# Patient Record
Sex: Male | Born: 1990 | Race: White | Hispanic: No | Marital: Single | Smoking: Current every day smoker
Health system: Southern US, Community
[De-identification: ages and names within clinical notes are randomized; demographics above are authoritative.]

## PROBLEM LIST (undated history)

## (undated) HISTORY — PX: WISDOM TOOTH EXTRACTION: SHX21

---

## 2012-03-22 ENCOUNTER — Emergency Department (HOSPITAL_BASED_OUTPATIENT_CLINIC_OR_DEPARTMENT_OTHER)
Admission: EM | Admit: 2012-03-22 | Discharge: 2012-03-22 | Disposition: A | Discharge status: Home / Self Care | Payer: 59 | Attending: Emergency Medicine | Admitting: Emergency Medicine

## 2012-03-22 ENCOUNTER — Encounter (HOSPITAL_BASED_OUTPATIENT_CLINIC_OR_DEPARTMENT_OTHER): Payer: Self-pay | Admitting: *Deleted

## 2012-03-22 DIAGNOSIS — F17200 Nicotine dependence, unspecified, uncomplicated: Secondary | ICD-10-CM | POA: Insufficient documentation

## 2012-03-22 DIAGNOSIS — Y9389 Activity, other specified: Secondary | ICD-10-CM | POA: Insufficient documentation

## 2012-03-22 DIAGNOSIS — W2209XA Striking against other stationary object, initial encounter: Secondary | ICD-10-CM | POA: Insufficient documentation

## 2012-03-22 DIAGNOSIS — Y929 Unspecified place or not applicable: Secondary | ICD-10-CM | POA: Insufficient documentation

## 2012-03-22 DIAGNOSIS — S0100XA Unspecified open wound of scalp, initial encounter: Secondary | ICD-10-CM | POA: Insufficient documentation

## 2012-03-22 DIAGNOSIS — S0101XA Laceration without foreign body of scalp, initial encounter: Secondary | ICD-10-CM

## 2012-03-22 MED ORDER — IBUPROFEN 400 MG PO TABS
600.0000 mg | ORAL_TABLET | Freq: Once | ORAL | Status: AC
  Administered 2012-03-22: 600 mg via ORAL
  Filled 2012-03-22: qty 1

## 2012-03-22 NOTE — ED Provider Notes (Signed)
History  This chart was scribed for Dale Racer, MD by Dale Morales, ED Scribe. This patient was seen in room MH03/[facility]03 and the patient's care was started at 3:25 PM.  CSN: [id number]  Arrival date & time 03/22/12  1546   First MD Initiated Contact with Patient 03/22/12 1725      Chief Complaint  Patient presents with  . Head Laceration     Patient is a 21 y.o. male presenting with scalp laceration. The history is provided by the patient. No language interpreter was used.  Head Laceration This is a new problem. The current episode started 3 to 5 hours ago. The problem occurs constantly. The problem has not changed since onset.Pertinent negatives include no chest pain, no abdominal pain, no headaches and no shortness of breath. Nothing aggravates the symptoms. Nothing relieves the symptoms. He has tried nothing for the symptoms.   Dale Morales is a 21 y.o. male who presents to the Emergency Department complaining of a laceration to the crown of the head after hitting his head on a light fixture while hanging Christmas decorations 3.5 hours ago. The bleeding is controlled currently. He reports pain behind right eye after hitting his head but denies taking OTC medications at home to improve symptoms. He denies LOC, nausea or blurred vision as associated symptoms. He does not have a h/o chronic medical conditions. He is a current everyday smoker and occasional alcohol user. TD vaccination is UTD, within the past 6 months.   History reviewed. No pertinent past medical history.  Past Surgical History  Procedure Date  . Wisdom tooth extraction     No family history on file.  History  Substance Use Topics  . Smoking status: Current Every Day Smoker  . Smokeless tobacco: Not on file  . Alcohol Use: 1.2 oz/week    2 Cans of beer per week      Review of Systems  Constitutional: Negative for fever and chills.  Respiratory: Negative for shortness of breath.   Cardiovascular:  Negative for chest pain.  Gastrointestinal: Negative for nausea, vomiting and abdominal pain.  Skin: Positive for wound (laceration to the crown of the head).  Neurological: Negative for headaches.  All other systems reviewed and are negative.    Allergies  Strattera and Sulfa antibiotics  Home Medications  No current outpatient prescriptions on file.  Triage Vitals: BP 110/69  Pulse 78  Temp 98.8 F (37.1 C) (Oral)  Resp 18  SpO2 100%  Physical Exam  Nursing note and vitals reviewed. Constitutional: He is oriented to person, place, and time. He appears well-developed and well-nourished. No distress.  HENT:  Head: Normocephalic and atraumatic.  Mouth/Throat: Oropharynx is clear and moist.       Linea 2 cm laceration to the crown of the head in the midline parietal area, no active bleeding  Eyes: Conjunctivae normal and EOM are normal. Pupils are equal, round, and reactive to light.  Neck: Normal range of motion. Neck supple. No tracheal deviation present.  Cardiovascular: Normal rate.   Pulmonary/Chest: Effort normal. No respiratory distress.  Musculoskeletal: Normal range of motion.  Neurological: He is alert and oriented to person, place, and time.  Skin: Skin is warm and dry.  Psychiatric: He has a normal mood and affect. His behavior is normal.    ED Course  Procedures (including critical care time)  DIAGNOSTIC STUDIES: Oxygen Saturation is 100% on room air, normal by my interpretation.    COORDINATION OF CARE: 5:58 PM- Applied two  staples to the midline parietal area, wound was well-approximated, pt tolerated well, no contamination    6:01 PM- Discussed discharge plan which includes motrin or tylenol for pain with pt and pt agreed to plan. Also advised pt to return to have staples removed in 7 days or if he develops changes in personality, nausea, emesis or severe HAs.   Labs Reviewed - No data to display No results found.   1. Scalp laceration      LACERATION REPAIR Performed by: Dale Morales Authorized by: Dale Morales, Dale Morales Consent: Verbal consent obtained. Risks and benefits: risks, benefits and alternatives were discussed Consent given by: patient Patient identity confirmed: provided demographic data Prepped and Draped in normal sterile fashion Wound explored  Laceration Location: scalp  Laceration Length: 2 cm  No Foreign Bodies seen or palpated  Anesthesia: local infiltration  Local anesthetic:none    Amount of cleaning: standard  Skin closure: staple  Number of staples: 2    Patient tolerance: Patient tolerated the procedure well with no immediate complications.   MDM  I personally performed the services described in this documentation, which was scribed in my presence. The recorded information has been reviewed and is accurate.    Dale Racer, MD 03/22/12 [phone]

## 2012-03-22 NOTE — ED Notes (Signed)
pts family member to the desk states " we are thinking about just leaving " told family  Member the physician is in with another patient and has signed up for him therefore will be in as soon as he can"

## 2012-03-22 NOTE — ED Notes (Signed)
Laceration cleansed bleeding was already controlled family member at the bedside states "We have been waiting so long it probably  already closed up" Explained to patient and family member that physician has signed up for them and will be in as soon as possible.

## 2012-03-22 NOTE — ED Notes (Signed)
Head lac in scalp from hitting head on light fixture- bleeding controlled- denies LOC

## 2012-03-29 ENCOUNTER — Emergency Department (HOSPITAL_BASED_OUTPATIENT_CLINIC_OR_DEPARTMENT_OTHER)
Admission: EM | Admit: 2012-03-29 | Discharge: 2012-03-29 | Disposition: A | Discharge status: Home / Self Care | Payer: 59 | Attending: Emergency Medicine | Admitting: Emergency Medicine

## 2012-03-29 ENCOUNTER — Encounter (HOSPITAL_BASED_OUTPATIENT_CLINIC_OR_DEPARTMENT_OTHER): Payer: Self-pay | Admitting: Emergency Medicine

## 2012-03-29 DIAGNOSIS — F17200 Nicotine dependence, unspecified, uncomplicated: Secondary | ICD-10-CM | POA: Insufficient documentation

## 2012-03-29 DIAGNOSIS — Z4802 Encounter for removal of sutures: Secondary | ICD-10-CM

## 2012-03-29 NOTE — ED Provider Notes (Signed)
History     CSN: [id number]  Arrival date & time 03/29/12  1138   First MD Initiated Contact with Patient 03/29/12 1154      Chief Complaint  Patient presents with  . Suture / Staple Removal    (Consider location/radiation/quality/duration/timing/severity/associated sxs/prior treatment) HPI Comments: Presents for staple removal from scalp. Denies pain, bleeding, drainage. No complaints. Place one week ago.  Patient is a 21 y.o. male presenting with suture removal. The history is provided by the patient.  Suture / Staple Removal  The sutures were placed 7 to 10 days ago. There has been no treatment since the wound repair.    No past medical history on file.  Past Surgical History  Procedure Date  . Wisdom tooth extraction     No family history on file.  History  Substance Use Topics  . Smoking status: Current Every Day Smoker  . Smokeless tobacco: Not on file  . Alcohol Use: 1.2 oz/week    2 Cans of beer per week      Review of Systems  Skin: Positive for wound.  Neurological: Negative for headaches.    Allergies  Strattera and Sulfa antibiotics  Home Medications  No current outpatient prescriptions on file.  BP 102/64  Pulse 72  Temp 98.7 F (37.1 C) (Oral)  Resp 18  Ht 5\' 10"  (1.778 m)  Wt 140 lb (63.504 kg)  BMI 20.09 kg/m2  SpO2 100%  Physical Exam  Constitutional: He is oriented to person, place, and time. He appears well-developed and well-nourished. No distress.  HENT:  Head: Normocephalic and atraumatic.  Mouth/Throat: Oropharynx is clear and moist. No oropharyngeal exudate.       2 staples intact vertex of scalp, well healed wound, no bleeding or drainage.  Eyes: Conjunctivae normal and EOM are normal. Pupils are equal, round, and reactive to light.  Neck: Normal range of motion. Neck supple.  Cardiovascular: Normal rate, regular rhythm and normal heart sounds.   No murmur heard. Pulmonary/Chest: Effort normal and breath sounds normal.  No respiratory distress.  Abdominal: Soft. There is no tenderness. There is no rebound and no guarding.  Musculoskeletal: Normal range of motion. He exhibits no edema and no tenderness.  Neurological: He is alert and oriented to person, place, and time. No cranial nerve deficit. He exhibits normal muscle tone.  Skin: Skin is warm.    ED Course  SUTURE REMOVAL Date/Time: 03/29/2012 12:09 PM Performed by: Glynn Octave Authorized by: Glynn Octave Consent: Verbal consent obtained. Risks and benefits: risks, benefits and alternatives were discussed Consent given by: patient Patient understanding: patient states understanding of the procedure being performed Patient identity confirmed: verbally with patient Body area: head/neck Location details: scalp Wound Appearance: clean Staples Removed: 2 Facility: sutures placed in this facility Patient tolerance: Patient tolerated the procedure well with no immediate complications.   (including critical care time)  Labs Reviewed - No data to display No results found.   1. Encounter for staple removal       MDM  Stable removal of the scalp. Wound healed well.        Glynn Octave, MD 03/29/12 1210

## 2012-03-29 NOTE — ED Notes (Signed)
Staple removal.  No problems or signs of infection.

## 2012-05-30 ENCOUNTER — Encounter (HOSPITAL_BASED_OUTPATIENT_CLINIC_OR_DEPARTMENT_OTHER): Payer: Self-pay | Admitting: *Deleted

## 2012-05-30 ENCOUNTER — Emergency Department (HOSPITAL_BASED_OUTPATIENT_CLINIC_OR_DEPARTMENT_OTHER): Payer: 59

## 2012-05-30 ENCOUNTER — Emergency Department (HOSPITAL_BASED_OUTPATIENT_CLINIC_OR_DEPARTMENT_OTHER)
Admission: EM | Admit: 2012-05-30 | Discharge: 2012-05-30 | Disposition: A | Discharge status: Home / Self Care | Payer: 59 | Attending: Emergency Medicine | Admitting: Emergency Medicine

## 2012-05-30 DIAGNOSIS — R5383 Other fatigue: Secondary | ICD-10-CM | POA: Insufficient documentation

## 2012-05-30 DIAGNOSIS — R42 Dizziness and giddiness: Secondary | ICD-10-CM | POA: Insufficient documentation

## 2012-05-30 DIAGNOSIS — F17200 Nicotine dependence, unspecified, uncomplicated: Secondary | ICD-10-CM | POA: Insufficient documentation

## 2012-05-30 DIAGNOSIS — R5381 Other malaise: Secondary | ICD-10-CM | POA: Insufficient documentation

## 2012-05-30 DIAGNOSIS — K5289 Other specified noninfective gastroenteritis and colitis: Secondary | ICD-10-CM | POA: Insufficient documentation

## 2012-05-30 DIAGNOSIS — R509 Fever, unspecified: Secondary | ICD-10-CM | POA: Insufficient documentation

## 2012-05-30 DIAGNOSIS — R197 Diarrhea, unspecified: Secondary | ICD-10-CM | POA: Insufficient documentation

## 2012-05-30 DIAGNOSIS — K529 Noninfective gastroenteritis and colitis, unspecified: Secondary | ICD-10-CM

## 2012-05-30 DIAGNOSIS — R109 Unspecified abdominal pain: Secondary | ICD-10-CM | POA: Insufficient documentation

## 2012-05-30 DIAGNOSIS — R51 Headache: Secondary | ICD-10-CM | POA: Insufficient documentation

## 2012-05-30 LAB — COMPREHENSIVE METABOLIC PANEL
ALT: 13 U/L (ref 0–53)
Alkaline Phosphatase: 87 U/L (ref 39–117)
BUN: 19 mg/dL (ref 6–23)
CO2: 22 mEq/L (ref 19–32)
Chloride: 103 mEq/L (ref 96–112)
GFR calc Af Amer: >90 mL/min (ref >90)
Glucose, Bld: 110 mg/dL — ABNORMAL HIGH (ref 70–99)
Potassium: 4.3 mEq/L (ref 3.5–5.1)
Sodium: 139 mEq/L (ref 135–145)
Total Bilirubin: 0.7 mg/dL (ref 0.3–1.2)
Total Protein: 7.9 g/dL (ref 6.0–8.3)

## 2012-05-30 LAB — CBC WITH DIFFERENTIAL/PLATELET
Hemoglobin: 17.3 g/dL — ABNORMAL HIGH (ref 13.0–17.0)
Lymphocytes Relative: 4 % — ABNORMAL LOW (ref 12–46)
Lymphs Abs: 0.5 10*3/uL — ABNORMAL LOW (ref 0.7–4.0)
MCH: 32.3 pg (ref 26.0–34.0)
Monocytes Relative: 5 % (ref 3–12)
Neutro Abs: 13.6 10*3/uL — ABNORMAL HIGH (ref 1.7–7.7)
Neutrophils Relative %: 91 % — ABNORMAL HIGH (ref 43–77)
Platelets: 189 10*3/uL (ref 150–400)
RBC: 5.35 MIL/uL (ref 4.22–5.81)
WBC: 15 10*3/uL — ABNORMAL HIGH (ref 4.0–10.5)

## 2012-05-30 LAB — URINALYSIS, ROUTINE W REFLEX MICROSCOPIC
Bilirubin Urine: NEGATIVE
Ketones, ur: NEGATIVE mg/dL
Nitrite: NEGATIVE
Protein, ur: NEGATIVE mg/dL
pH: 5 (ref 5.0–8.0)

## 2012-05-30 MED ORDER — SODIUM CHLORIDE 0.9 % IV SOLN
Freq: Once | INTRAVENOUS | Status: AC
  Administered 2012-05-30: 19:00:00 via INTRAVENOUS

## 2012-05-30 MED ORDER — ONDANSETRON HCL 4 MG PO TABS: 4.0000 mg | ORAL_TABLET | Freq: Four times a day (QID) | ORAL | Status: AC

## 2012-05-30 MED ORDER — SODIUM CHLORIDE 0.9 % IV SOLN
Freq: Once | INTRAVENOUS | Status: AC
  Administered 2012-05-30: 15:00:00 via INTRAVENOUS

## 2012-05-30 MED ORDER — HYDROMORPHONE HCL PF 1 MG/ML IJ SOLN
1.0000 mg | Freq: Once | INTRAMUSCULAR | Status: DC
  Filled 2012-05-30: qty 1

## 2012-05-30 MED ORDER — ACETAMINOPHEN 325 MG PO TABS
650.0000 mg | ORAL_TABLET | Freq: Once | ORAL | Status: AC
  Administered 2012-05-30: 650 mg via ORAL
  Filled 2012-05-30: qty 2

## 2012-05-30 MED ORDER — IOHEXOL 300 MG/ML  SOLN
50.0000 mL | Freq: Once | INTRAMUSCULAR | Status: AC | PRN
  Administered 2012-05-30: 50 mL via ORAL

## 2012-05-30 MED ORDER — IOHEXOL 300 MG/ML  SOLN
100.0000 mL | Freq: Once | INTRAMUSCULAR | Status: AC | PRN
  Administered 2012-05-30: 100 mL via INTRAVENOUS

## 2012-05-30 MED ORDER — PROMETHAZINE HCL 25 MG/ML IJ SOLN
12.5000 mg | Freq: Once | INTRAMUSCULAR | Status: AC
  Administered 2012-05-30: 12.5 mg via INTRAVENOUS
  Filled 2012-05-30: qty 1

## 2012-05-30 MED ORDER — SODIUM CHLORIDE 0.9 % IV BOLUS (SEPSIS)
1000.0000 mL | Freq: Once | INTRAVENOUS | Status: AC
  Administered 2012-05-30: 1000 mL via INTRAVENOUS

## 2012-05-30 MED ORDER — IBUPROFEN 200 MG PO TABS
200.0000 mg | ORAL_TABLET | Freq: Once | ORAL | Status: DC
  Filled 2012-05-30: qty 1

## 2012-05-30 MED ORDER — ONDANSETRON HCL 4 MG/2ML IJ SOLN
4.0000 mg | INTRAMUSCULAR | Status: AC
  Administered 2012-05-30: 4 mg via INTRAVENOUS
  Filled 2012-05-30: qty 2

## 2012-05-30 MED ORDER — HYDROMORPHONE HCL PF 1 MG/ML IJ SOLN
1.0000 mg | Freq: Once | INTRAMUSCULAR | Status: AC
  Administered 2012-05-30: 1 mg via INTRAVENOUS
  Filled 2012-05-30: qty 1

## 2012-05-30 MED ORDER — OXYCODONE-ACETAMINOPHEN 5-325 MG PO TABS: 2.0000 | ORAL_TABLET | ORAL | Status: AC | PRN

## 2012-05-30 MED ORDER — ONDANSETRON HCL 4 MG/2ML IJ SOLN
INTRAMUSCULAR | Status: AC
  Administered 2012-05-30: 4 mg via INTRAVENOUS
  Filled 2012-05-30: qty 2

## 2012-05-30 MED ORDER — ONDANSETRON HCL 4 MG/2ML IJ SOLN
4.0000 mg | Freq: Once | INTRAMUSCULAR | Status: AC
  Administered 2012-05-30: 4 mg via INTRAVENOUS

## 2012-05-30 NOTE — ED Notes (Signed)
Gave pt ice water to drink 

## 2012-05-30 NOTE — ED Notes (Signed)
Pt reports he is feeling better- tolerating PO fluids at this time- EDPA notified

## 2012-05-30 NOTE — ED Provider Notes (Signed)
History     CSN: [id number]  Arrival date & time 05/30/12  1406   First MD Initiated Contact with Patient 05/30/12 1407      Chief Complaint  Patient presents with  . Emesis    (Consider location/radiation/quality/duration/timing/severity/associated sxs/prior treatment) HPI Comments: Patient states that he moved here from [place], [place] 6 months ago and that he has had this type of abdominal pain since he was 22 yrs old. Pain is intermittent, nonradiating and isolated in both his left and right upper quadrant. 10/10 pain today. Patient admits to 10+ episodes of emesis and noticing a "clot of blood" in one of his episodes of emesis. 3+ episodes of emesis since coughing up this "blood clot" have been non-bloody. Patient admits to being "worked up" for his recurring abdominal pain; no cause determined. No FHx of crohn's dx, IBD, or IBS; states was worked up for [employer] last year and "nothing came of it". No hx of abdominal surgery. Denies trauma. + sick contacts; girlfriend had viral abdominal illness last week  Patient is a 22 y.o. male presenting with vomiting. The history is provided by the patient. No language interpreter was used.  Emesis Severity:  Moderate Duration:  4 hours Timing:  Constant Number of daily episodes:  10 Quality:  Bilious material and stomach contents (emesis of a "blood clot" x 1; denies blood in last few episodes of emesis) Feeding tolerance: none. Progression:  Worsening Chronicity:  New Relieved by:  Nothing Exacerbated by: stretching; lying flat. Associated symptoms: abdominal pain, chills, diarrhea and headaches   Associated symptoms: no fever   Abdominal pain:    Location:  LUQ and RUQ   Quality:  Stabbing   Severity:  Moderate   Onset quality:  Sudden   Duration:  4 hours   Timing:  Constant   Progression:  Unchanged   Chronicity:  Recurrent Diarrhea:    Quality:  Watery   Number of occurrences:  15   Severity:  Moderate   Duration:  4 hours  Timing:  Intermittent   Progression:  Unchanged Risk factors: sick contacts   Risk factors: no alcohol use, no diabetes and no travel to endemic areas   Risk factors comment:  Girlfriend had viral gastroenteritis last week   History reviewed. No pertinent past medical history.  Past Surgical History  Procedure Laterality Date  . Wisdom tooth extraction      History reviewed. No pertinent family history.  History  Substance Use Topics  . Smoking status: Current Every Day Smoker  . Smokeless tobacco: Not on file  . Alcohol Use: 1.2 oz/week    2 Cans of beer per week     Review of Systems  Constitutional: Positive for chills. Negative for fever.  HENT: Negative for trouble swallowing.   Eyes: Negative for photophobia, pain, discharge and visual disturbance.  Respiratory: Negative for shortness of breath.   Cardiovascular: Negative for chest pain.  Gastrointestinal: Positive for nausea, vomiting, abdominal pain and diarrhea. Negative for blood in stool and abdominal distention.  Genitourinary: Negative for dysuria, hematuria and flank pain.  Skin: Negative for color change.  Neurological: Positive for weakness, light-headedness and headaches. Negative for dizziness and syncope.  All other systems reviewed and are negative.    Allergies  Strattera and Sulfa antibiotics  Home Medications   Current Outpatient Rx  Name  Route  Sig  Dispense  Refill  . ondansetron (ZOFRAN) 4 MG tablet   Oral   Take 1 tablet (4 mg total)  by mouth every 6 (six) hours.   20 tablet   0   . oxyCODONE-acetaminophen (PERCOCET/ROXICET) 5-325 MG per tablet   Oral   Take 2 tablets by mouth every 4 (four) hours as needed for pain.   12 tablet   0     BP 98/47  Pulse 86  Temp(Src) 101.2 F (38.4 C) (Oral)  Resp 84  Ht 5\' 9"  (1.753 m)  Wt 135 lb (61.236 kg)  BMI 19.93 kg/m2  SpO2 98%  Physical Exam  Nursing note and vitals reviewed. Constitutional: He is oriented to person, place,  and time. He appears well-developed and well-nourished. No distress.  HENT:  Head: Normocephalic and atraumatic.  Mouth/Throat: Oropharynx is clear and moist. No oropharyngeal exudate.  Eyes: Conjunctivae and EOM are normal. Pupils are equal, round, and reactive to light. Right eye exhibits no discharge. Left eye exhibits no discharge. No scleral icterus.  Neck: Normal range of motion.  Cardiovascular: Regular rhythm and normal heart sounds.   tachycardic  Pulmonary/Chest: Effort normal and breath sounds normal. No respiratory distress. He has no wheezes.  Abdominal: Soft. He exhibits no distension, no ascites and no mass. Bowel sounds are decreased. There is tenderness in the right upper quadrant and left upper quadrant. There is positive Murphy's sign. There is no rebound, no guarding, no CVA tenderness and no tenderness at McBurney's point.  No peritoneal signs  Musculoskeletal: Normal range of motion.  Lymphadenopathy:    He has no cervical adenopathy.  Neurological: He is alert and oriented to person, place, and time.  Skin: Skin is warm. No rash noted. He is not diaphoretic. No erythema.  Psychiatric: He has a normal mood and affect. His behavior is normal.    ED Course  Procedures (including critical care time)  Labs Reviewed  CBC WITH DIFFERENTIAL - Abnormal; Notable for the following:    WBC 15.0 (*)    Hemoglobin 17.3 (*)    MCHC 37.0 (*)    Neutrophils Relative 91 (*)    Neutro Abs 13.6 (*)    Lymphocytes Relative 4 (*)    Lymphs Abs 0.5 (*)    All other components within normal limits  COMPREHENSIVE METABOLIC PANEL - Abnormal; Notable for the following:    Glucose, Bld 110 (*)    All other components within normal limits  URINALYSIS, ROUTINE W REFLEX MICROSCOPIC  LIPASE, BLOOD   Ct Abdomen Pelvis W Contrast  05/30/2012  *RADIOLOGY REPORT*  Clinical Data: Sudden onset vomiting and abdominal pain.  CT ABDOMEN AND PELVIS WITH CONTRAST  Technique:  Multidetector CT  imaging of the abdomen and pelvis was performed following the standard protocol during bolus administration of intravenous contrast.  Contrast: 50mL OMNIPAQUE IOHEXOL 300 MG/ML  SOLN, OMNIPAQUE IOHEXOL 300 MG/ML  SOLN  Comparison: None.  Findings: Lung Bases: Clear.  Liver:  Normal.  Spleen:  Normal.  Gallbladder:  Normal.  Common bile duct:  Normal.  Pancreas:  Normal.  Adrenal glands:  Normal.  Kidneys:  Normal enhancement.  Stomach:  Small hiatal hernia.  Stomach is decompressed with redundant fold in the proximal fundus.  Small bowel:  Normal appearance of the duodenum.  Proximal small bowel is decompressed.  Paucity of oral contrast and small bowel. No inflammatory changes.  No mesenteric adenopathy.  No free air or free fluid.  Colon:   There is a mild mural thickening and hyperenhancement of the ascending colon compatible with colitis.  In this location, favor infectious colitis.  Normal appendix.  Pelvic Genitourinary:  Normal.  Distended urinary bladder.  Bones:  No aggressive osseous lesions.  Vasculature: Normal.  IMPRESSION: Ascending colitis.  The differential considerations include enteric infection or inflammatory bowel disease. Favor enteric infection. The distal colon and small bowel appears within normal limits.   Original Report Authenticated By: Andreas Newport, M.D.     1. Colitis, acute   2. Fever   3. Vomiting and diarrhea      MDM  Patient with stated 13 year hx of abdominal pain of unknown etiology with (-) crohn's w/u presents for intermittent, stabbing, non-radiating LUQ and RUQ abdominal pain. Admits to N/V/D, lightheadedness and headache. No fever, CP, SOB, tachypnea, dysuria, hematuria, melena, and hematochezia. + Murphy's sign; McBurney's negative and no peritoneal signs. + leukocytosis, negative lipase. DDx of viral gastroenteritis vs cholecystitis vs atypical appendicitis vs IBD. Records from [facility] in [place], [place] state last CT abd/pelvis in 06/2011 was  normal.  CT + for ascending colitis favoring enteric infection. Patient's BP dropped to 88/44 at rest. Patient has received 2L IVF and is the process of being given another bolus. Will monitor BP. Patient also febrile now; temp 102.64F. Tylenol given for fever control per protocol. Patient discussed with Langston Masker, PA-C. Patient does not want to be admitted. Langston Masker, PA-C has spoken with the patient; told that he needs to tolerate fluids PO in order to be stable enough for discharge.   Patient tolerating fluids PO and states he wants to go home. Patient's BP has improved after receiving fluids and temperature trending down after receiving tylenol. Patient non-toxic and stable enough for discharge. Have discussed this with both Dr. Bernette Mayers and Langston Masker, PA-C who agree the patient is stable enough for d/c. Patient given zofran for nausea and percocet to take as needed for pain. Instructed to follow up with a PCP to ensure symptom resolution; have provided resource guide as patient is new to the area. Should return to ED if symptoms worsen. Follow up with a GI doctor was discussed with the patient and may be provided by a PCP if deemed necessary.     Antony Madura, PA-C 05/30/12 2359

## 2012-05-30 NOTE — ED Notes (Signed)
PA at bedside talking to patient and family

## 2012-05-30 NOTE — ED Notes (Signed)
Vomiting and abd pain onset this a.m.

## 2012-05-31 NOTE — ED Provider Notes (Signed)
Medical screening examination/treatment/procedure(s) were performed by non-physician practitioner and as supervising physician I was immediately available for consultation/collaboration.   Charles B. Sheldon, MD 05/31/12 0141 

## 2013-09-20 ENCOUNTER — Ambulatory Visit (HOSPITAL_COMMUNITY)
Admission: RE | Admit: 2013-09-20 | Discharge: 2013-09-20 | Disposition: A | Discharge status: Home / Self Care | Payer: Self-pay | Attending: Psychiatry | Admitting: Psychiatry

## 2013-09-20 NOTE — BH Assessment (Signed)
Assessment Note  Dale Morales is an 23 y.o. male who came to [facility] as a walk in requesting help for anxiety and relationship problems.  He is requesting Op referrals.  Pt works full time at [employer] and lives with friends.  He has a 73 month old daughter, and his GF just moved out and took the baby to her mom's house.  Pt is very upset and anxious about this because they have no child custody arrangements, and he is very involved with his daughter.  Pt said that it has been hard for him to concentrate at work and that he had a panic attack today thinking about it.  Pt denies depression and endorses no depressive symptoms.  He denies SI, HI, A/V hallucinations and violence.  Pt's affect is very pleasant and a little anxious.  He says his appetite and sleep are normal.  Pt says he has tried to get op help, but the provider he had an appt with closed down.  He says he just wants to process his problems with someone and see what he should do legally about his daughter and see what sh should do about his anxiety.  Per Renata Caprice, NP, pt does not meet IP criteria for admission, and needs OP resources.  Writer provided supportive counseling and OP referrals to pt and pt seems happy with an Op plan.  Axis I: Anxiety Disorder NOS Axis II: Deferred Axis III: No past medical history on file. Axis IV: other psychosocial or environmental problems Axis V: 51-60 moderate symptoms  Past Medical History: No past medical history on file.  Past Surgical History  Procedure Laterality Date  . Wisdom tooth extraction      Family History: No family history on file.  Social History:  reports that he has been smoking.  He does not have any smokeless tobacco history on file. He reports that he drinks about 1.2 ounces of alcohol per week. His drug history is not on file.  Additional Social History:  Alcohol / Drug Use Pain Medications: denies Prescriptions: denies Over the Counter: denies History of alcohol / drug use?:  No history of alcohol / drug abuse Longest period of sobriety (when/how long): denies  CIWA:   COWS:    Allergies:  Allergies  Allergen Reactions  . Strattera [Atomoxetine Hcl] Swelling  . Sulfa Antibiotics Swelling    Home Medications:  (Not in a hospital admission)  OB/GYN Status:  No LMP for male patient.  General Assessment Data Location of Assessment: [facility] Is this a Tele or Face-to-Face Assessment?: Face-to-Face Is this an Initial Assessment or a Re-assessment for this encounter?: Initial Assessment Living Arrangements:  (friends) Can pt return to current living arrangement?: Yes Admission Status: Voluntary Is patient capable of signing voluntary admission?: Yes Transfer from: Home Referral Source: Self/Family/Friend  Medical Screening Exam [facility] Walk-in ONLY) Medical Exam completed: No Reason for MSE not completed: Patient Refused  [facility] Crisis Care Plan Living Arrangements:  (friends)  Education Status Is patient currently in school?: No  Risk to self Suicidal Ideation: No Suicidal Intent: No Is patient at risk for suicide?: No Suicidal Plan?: No Access to Means: No What has been your use of drugs/alcohol within the last 12 months?: none Previous Attempts/Gestures: No Other Self Harm Risks:  (none known) Intentional Self Injurious Behavior: None Family Suicide History: No Recent stressful life event(s): Conflict (Comment) (with GF) Persecutory voices/beliefs?: No Depression: No Depression Symptoms:  (denies) Substance abuse history and/or treatment for substance  abuse?: No Suicide prevention information given to non-admitted patients: Yes  Risk to Others Homicidal Ideation: No Thoughts of Harm to Others: No Current Homicidal Intent: No Current Homicidal Plan: No Access to Homicidal Means: No History of harm to others?: No Assessment of Violence: None Noted Does patient have access to weapons?: No Criminal Charges Pending?:  No Does patient have a court date: No  Psychosis Hallucinations: None noted Delusions: None noted  Mental Status Report Appear/Hygiene:  (casual) Eye Contact: Good Motor Activity: Unremarkable Speech: Logical/coherent Level of Consciousness: Alert Mood: Anxious (appropriate to circumstance) Affect: Anxious;Appropriate to circumstance Anxiety Level: Panic Attacks Panic attack frequency:  (1x/mo) Most recent panic attack:  (today) Thought Processes: Coherent;Relevant Judgement: Unimpaired Orientation: Person;Place;Time;Situation Obsessive Compulsive Thoughts/Behaviors: None  Cognitive Functioning Concentration: Decreased Memory: Recent Intact;Remote Intact IQ: Average Insight: Good Impulse Control: Good Appetite: Good Weight Loss: 0 Weight Gain: 0 Sleep: No Change Total Hours of Sleep: 5 Vegetative Symptoms: None  ADLScreening [facility]) Patient's cognitive ability adequate to safely complete daily activities?: Yes Patient able to express need for assistance with ADLs?: Yes Independently performs ADLs?: Yes (appropriate for developmental age)  Prior Inpatient Therapy Prior Inpatient Therapy: No  Prior Outpatient Therapy Prior Outpatient Therapy: No  ADL Screening (condition at time of admission) Patient's cognitive ability adequate to safely complete daily activities?: Yes Is the patient deaf or have difficulty hearing?: No Does the patient have difficulty seeing, even when wearing glasses/contacts?: No Does the patient have difficulty concentrating, remembering, or making decisions?: No Patient able to express need for assistance with ADLs?: Yes Does the patient have difficulty dressing or bathing?: No Independently performs ADLs?: Yes (appropriate for developmental age) Does the patient have difficulty walking or climbing stairs?: No  Home Assistive Devices/Equipment Home Assistive Devices/Equipment: None    Abuse/Neglect Assessment  (Assessment to be complete while patient is alone) Physical Abuse: Denies Verbal Abuse: Denies Sexual Abuse: Denies Exploitation of patient/patient's resources: Denies Self-Neglect: Denies Values / Beliefs Cultural Requests During Hospitalization: None Spiritual Requests During Hospitalization: None Consults Spiritual Care Consult Needed: No Social Work Consult Needed: No Merchant navy officerAdvance Directives (For Healthcare) Advance Directive: Patient does not have advance directive Pre-existing out of facility DNR order (yellow form or pink MOST form): No    Additional Information 1:1 In Past 12 Months?: No CIRT Risk: No Elopement Risk: No Does patient have medical clearance?: No     Disposition:  Disposition Initial Assessment Completed for this Encounter: Yes Disposition of Patient: Outpatient treatment Type of outpatient treatment: Adult  On Site Evaluation by:   Reviewed with Physician:    Theo DillsEmily Hines Lavonta Tillis 09/20/2013 7:19 PM

## 2014-01-13 IMAGING — CT CT ABD-PELV W/ CM
2 of 4 series · 16 of 46 positions shown, 18 images · IV contrast (APPLIED)
Comparison: None.

CLINICAL DATA: Sudden onset vomiting and abdominal pain.

CT ABDOMEN AND PELVIS WITH CONTRAST
TECHNIQUE: Multidetector CT imaging of the abdomen and pelvis was
performed following the standard protocol during bolus
administration of intravenous contrast.
Contrast: 50mL OMNIPAQUE IOHEXOL 300 MG/ML  SOLN, 100mL OMNIPAQUE
IOHEXOL 300 MG/ML  SOLN

[Series 2: abd/pelvis 5.0 b31f · axial · 0.68mm/px · z∈[-515,-115]mm · 13 of 88 slices shown, 15 images]
[im 4/88  soft-tissue]
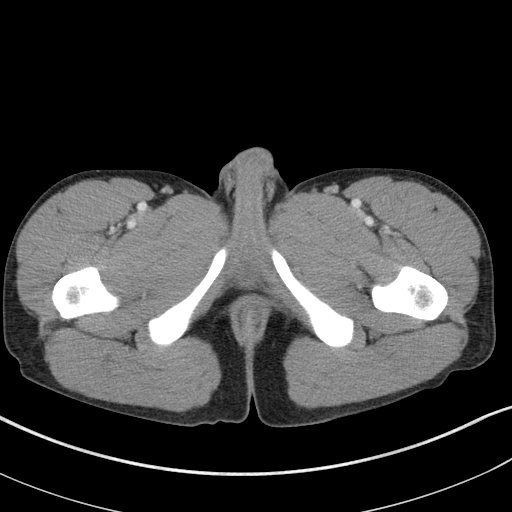
[im 4/88  bone]
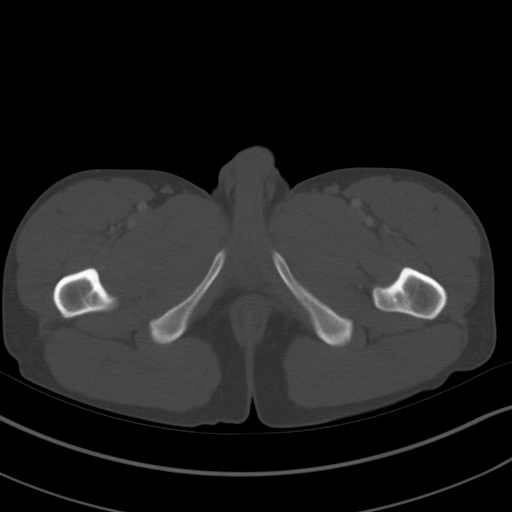
[im 11/88  soft-tissue]
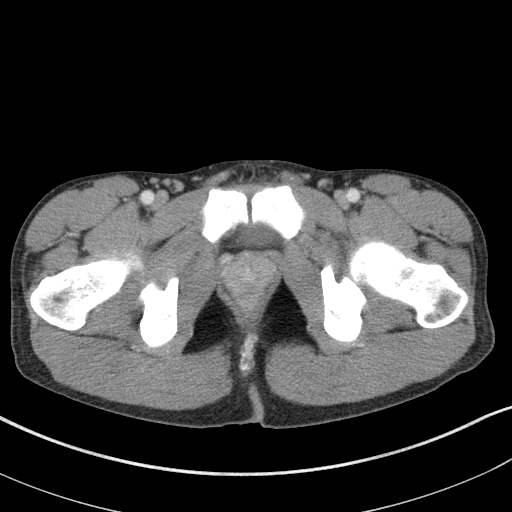
[im 17/88  soft-tissue]
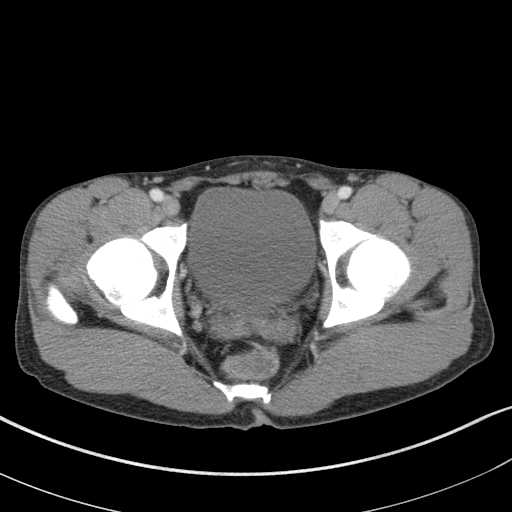
[im 24/88  soft-tissue]
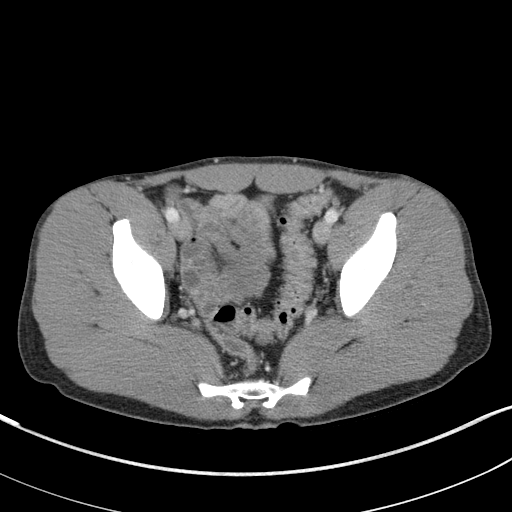
[im 31/88  soft-tissue]
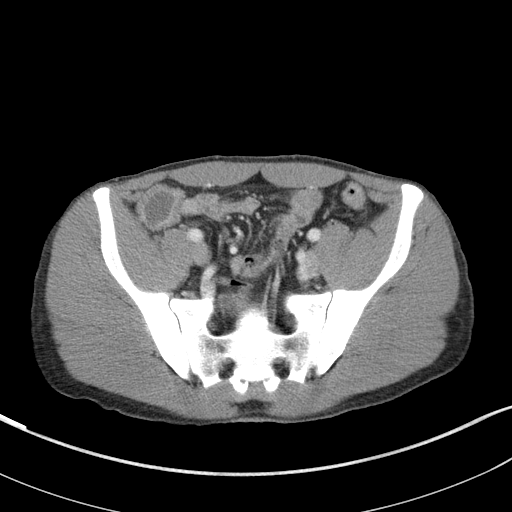
[im 37/88  soft-tissue]
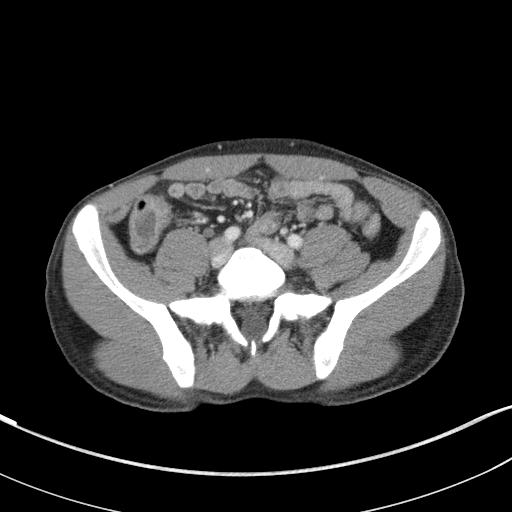
[im 44/88  soft-tissue]
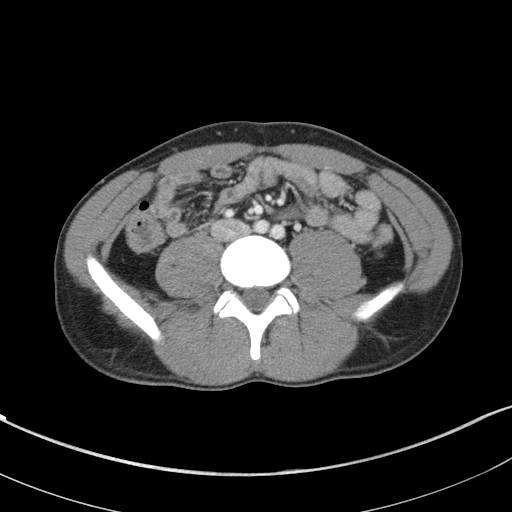
[im 51/88  soft-tissue]
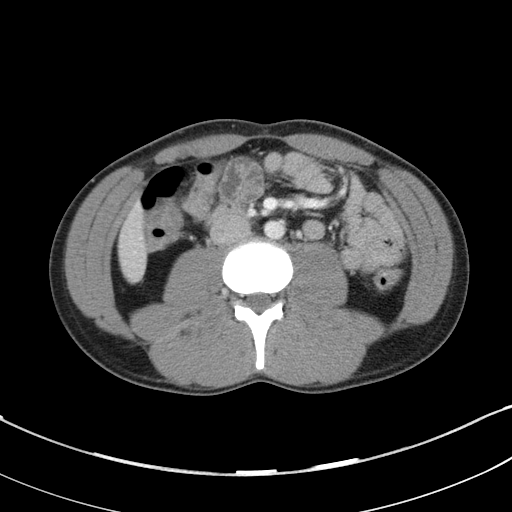
[im 57/88  soft-tissue]
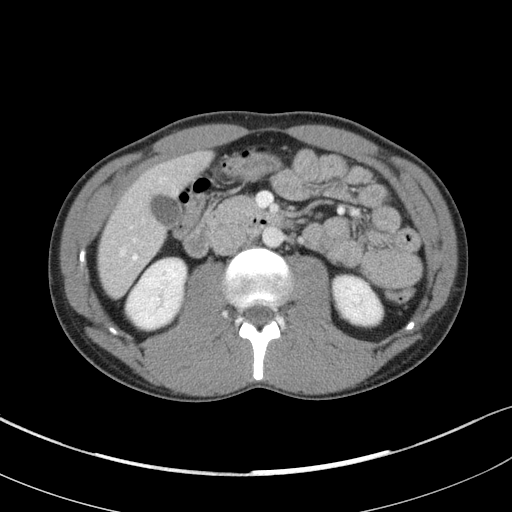
[im 57/88  bone]
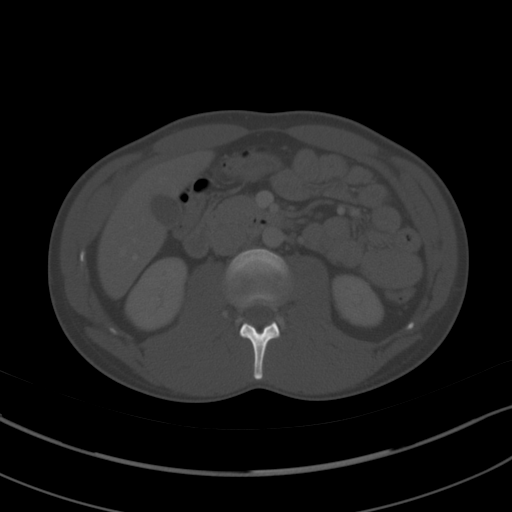
[im 64/88  soft-tissue]
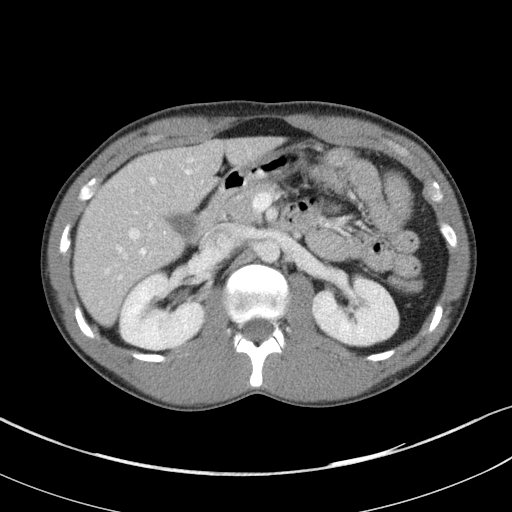
[im 71/88  soft-tissue]
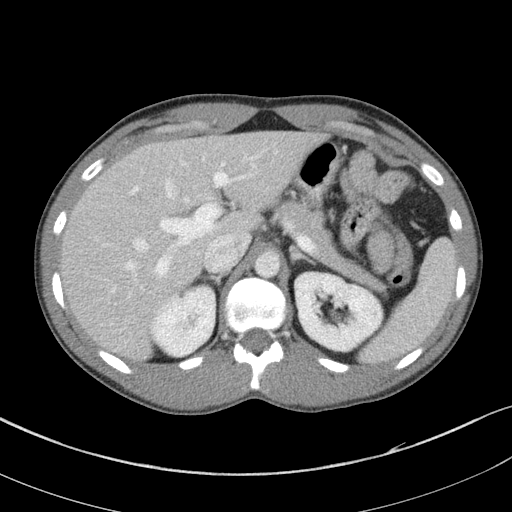
[im 77/88  soft-tissue]
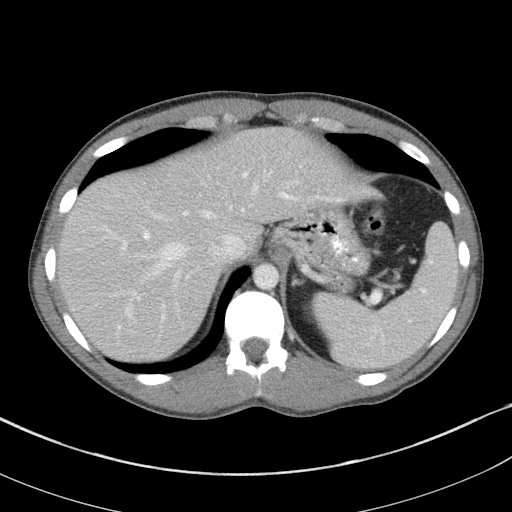
[im 84/88  soft-tissue]
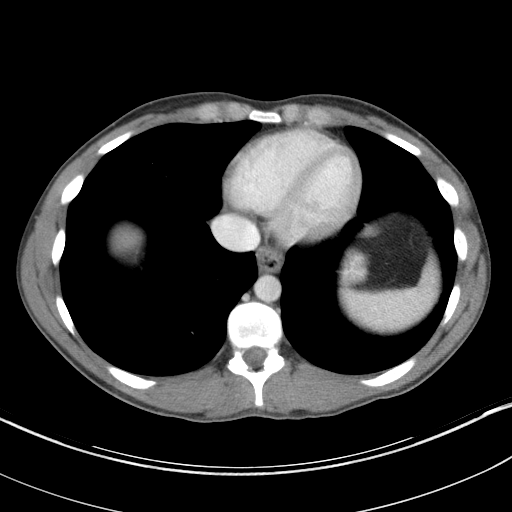

[Series 5: abd/pelvis 3.0 coronal · coronal · 0.80mm/px · 3 of 68 slices shown]
[im 23/68  soft-tissue]
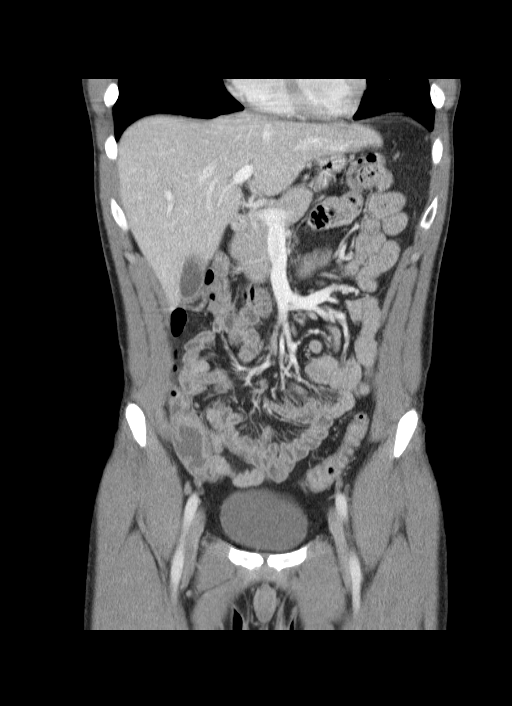
[im 30/68  soft-tissue]
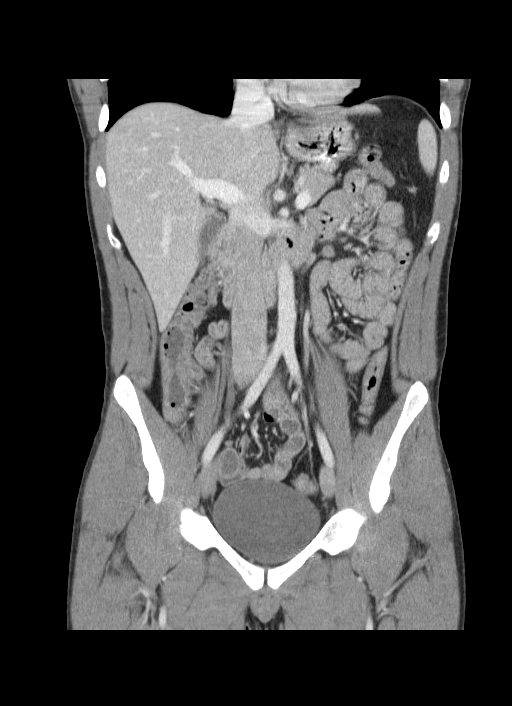
[im 38/68  soft-tissue]
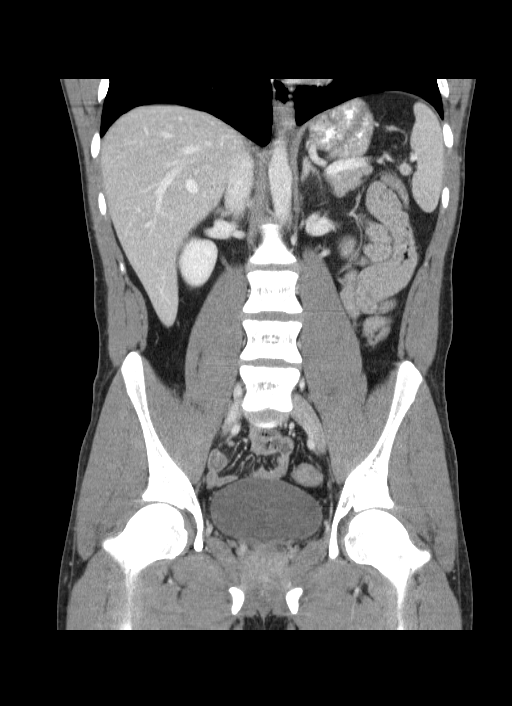

[16 of 46 positions shown; findings below may reference images not displayed]

FINDINGS: Lung Bases: Clear.

Liver:  Normal.

Spleen:  Normal.

Gallbladder:  Normal.

Common bile duct:  Normal.

Pancreas:  Normal.

Adrenal glands:  Normal.

Kidneys:  Normal enhancement.

Stomach:  Small hiatal hernia.  Stomach is decompressed with
redundant fold in the proximal fundus.

Small bowel:  Normal appearance of the duodenum.  Proximal small
bowel is decompressed.  Paucity of oral contrast and small bowel.
No inflammatory changes.  No mesenteric adenopathy.  No free air or
free fluid.

Colon:   There is a mild mural thickening and hyperenhancement of
the ascending colon compatible with colitis.  In this location,
favor infectious colitis.  Normal appendix.

Pelvic Genitourinary:  Normal.  Distended urinary bladder.

Bones:  No aggressive osseous lesions.

Vasculature: Normal.
IMPRESSION: Ascending colitis.  The differential considerations include enteric
infection or inflammatory bowel disease. Favor enteric infection.
The distal colon and small bowel appears within normal limits.
